# Patient Record
Sex: Male | Born: 1990 | Race: Black or African American | Hispanic: No | Marital: Single | Smoking: Former smoker
Health system: Southern US, Community
[De-identification: ages and names within clinical notes are randomized; demographics above are authoritative.]

---

## 2015-11-10 ENCOUNTER — Encounter (HOSPITAL_COMMUNITY): Payer: Self-pay

## 2015-11-10 ENCOUNTER — Emergency Department (HOSPITAL_COMMUNITY)
Admission: EM | Admit: 2015-11-10 | Discharge: 2015-11-10 | Disposition: A | Discharge status: Home / Self Care | Payer: Self-pay | Attending: Emergency Medicine | Admitting: Emergency Medicine

## 2015-11-10 DIAGNOSIS — K0889 Other specified disorders of teeth and supporting structures: Secondary | ICD-10-CM | POA: Insufficient documentation

## 2015-11-10 MED ORDER — NAPROXEN 500 MG PO TABS: 500.0000 mg | ORAL_TABLET | Freq: Two times a day (BID) | ORAL | Status: DC

## 2015-11-10 NOTE — Discharge Instructions (Signed)
Take the prescribed medication as directed. Follow-up with dentist on call or you may refer to the list of dental clinics below. Return to the ED for new or worsening symptoms.   [facility] [employer] The [employer] 211 is a great source of information about community services available.  Access by dialing 2-1-1 from anywhere in [place], or by website -  PooledIncome.plwww.nc211.org.   Other Local Resources (Updated 06/2015)  Dental  Care   Services    Phone Number and Address  Cost  [facility] For children 690 - 24 years of age:   Cleaning  Tooth brushing/flossing instruction  Sealants, fillings, crowns  Extractions  Emergency treatment  [phone] [address] [place], [place] [address] Charges based on family income.  Medicaid and some insurance plans accepted.     [facility]  Cleaning  Sealants, fillings, crowns  Extractions  Emergency treatment [phone] [address] [place], [place]  Pregnant women 24 years of age or older with a Medicaid card  [facility] [place]  Cleaning  Sealants, fillings, crowns  Extractions  Emergency treatment [phone] [address] [place], [place] Pregnant women 24 years of age or older with a Medicaid card  [facility] For children 690 - 24 years of age:   Cleaning  Tooth brushing/flossing instruction  Sealants, fillings, crowns  Extractions  Emergency treatment Limited orthodontic services for patients with Medicaid [phone] [address] [place], [place] [address] Medicaid and [facility] Health Choice cover for children up to age 24 and pregnant women.  Parents of children up to age 24 without Medicaid pay a reduced fee at time of service.  [facility] [place] For children 620 - 24 years of age:    Cleaning  Tooth brushing/flossing instruction  Sealants, fillings, crowns  Extractions  Emergency treatment Limited orthodontic services for patients with Medicaid [phone] [address] [place], [place].  Medicaid and [place] Health Choice cover for children up to age 24 and pregnant women.  Parents of children up to age 24 without Medicaid pay a reduced fee.  [facility]  Cleaning  Sealants, fillings, crowns  Extractions  Hours: Tuesdays and Thursdays, 4:15 - 8 pm [phone] [address], [address] [place], [place] [address] Services free of charge to [facility] residents ages 18-64 who do not have health insurance, Medicare, [place], or [place] benefits and fall within federal poverty guidelines  [facility]    Provides dental care in addition to primary medical care, nutritional counseling, and pharmacy:  Nurse, mental healthCleaning  Sealants, fillings, crowns  Extractions                  [phone] [facility], [address] [place], [place]  [phone] [facility], [address] [place], [place]  [phone] [facility] [place], [place]  [phone] [facility], [address] [place], [place]  [phone] [facility] [address] [place], [place] Accepts [place], [place]are, most insurance.  Also provides services available to all with fees adjusted based on ability to pay.    [facility]  Cleaning  Tooth brushing/flossing instruction  Sealants, fillings, crowns  Extractions  Emergency treatment Hours: Tuesdays, Thursdays, and Fridays from 8  am to 5 pm by appointment only. [phone] [address] [place], [place] [address] [facility] residents with Medicaid (depending on eligibility) and children with [facility] Health Choice - call for more  information.  [facility]  Extractions only  Hours: 2nd and 4th Thursday of each month from 6:30 am - 9 am.   [phone] [address] [place], [place] [address] Ages 92 and older only.  Patients are seen on a first come, first served basis.  [employer]  [employer]  Extractions  Orthodontics  Endodontics  Implants/Crowns/Bridges  Complete and partial dentures [phone] [place], [place] Patients must complete an application for services.  There is often a waiting list.

## 2015-11-10 NOTE — ED Notes (Signed)
Patient here with 2 weeks of left lower dental pain and jaw pain, wisdom tooth eruption per patient

## 2015-11-10 NOTE — ED Notes (Signed)
Declined W/C at D/C and was escorted to lobby by RN. 

## 2015-11-10 NOTE — ED Provider Notes (Signed)
CSN: [id number]     Arrival date & time 11/10/15  [id number] History  By signing my name below, I, Emmanuella Mensah, attest that this documentation has been prepared under the direction and in the presence of Sharilyn SitesLisa Sanders, PA-C. Electronically Signed: Angelene GiovanniEmmanuella Mensah, ED Scribe. 11/10/2015. 10:28 AM.    Chief Complaint  Patient presents with  . Dental Pain   The history is provided by the patient. No language interpreter was used.   HPI Comments: Roger Ford is a 25 y.o. male who presents to the Emergency Department complaining of gradually worsening left lower dental pain onset 2 weeks ago. Pt explains that he believes the pain is due to a wisdom tooth coming in. He states that he has tried Advil with temporary relief. He reports NKDA. Pt denies any fever or chills.  No facial or neck swelling.  No current dentist, [place].    History reviewed. No pertinent past medical history. History reviewed. No pertinent past surgical history. No family history on file. Social History  Substance Use Topics  . Smoking status: Never Smoker   . Smokeless tobacco: None  . Alcohol Use: None    Review of Systems  Constitutional: Negative for fever and chills.  HENT: Positive for dental problem.   All other systems reviewed and are negative.     Allergies  Review of patient's allergies indicates no known allergies.  Home Medications   Prior to Admission medications   Not on File   BP 127/81 mmHg  Pulse 54  Temp(Src) 98.2 F (36.8 C) (Oral)  Resp 16  SpO2 99%   Physical Exam  Constitutional: He is oriented to person, place, and time. He appears well-developed and well-nourished.  HENT:  Head: Normocephalic and atraumatic.  Mouth/Throat: Oropharynx is clear and moist.  Teeth largely in fair dentition, left lower wisdom tooth has erupted through gum line, surrounding gingiva normal in appearance, no signs of dental abscess, handling secretions appropriately,  no trismus, no facial or neck swelling, normal phonation without stridor  Eyes: Conjunctivae and EOM are normal. Pupils are equal, round, and reactive to light.  Neck: Normal range of motion.  Cardiovascular: Normal rate, regular rhythm and normal heart sounds.   Pulmonary/Chest: Effort normal and breath sounds normal.  Abdominal: Soft. Bowel sounds are normal.  Musculoskeletal: Normal range of motion.  Neurological: He is alert and oriented to person, place, and time.  Skin: Skin is warm and dry.  Psychiatric: He has a normal mood and affect.  Nursing note and vitals reviewed.   ED Course  Procedures (including critical care time) DIAGNOSTIC STUDIES: Oxygen Saturation is 99% on RA, normal by my interpretation.    COORDINATION OF CARE: 10:27 AM- Pt advised of plan for treatment and pt agrees. Pt will receive pain medication. Will provide resources for dental follow up.    MDM   Final diagnoses:  Pain, dental   25 year old male here with left lower dental pain for the past 2 weeks. Reports he thinks is due to a wisdom tooth coming in. On exam it does appear the patient's left lower wisdom tooth has begun erupting through the gumline. He has no signs of dental abscess currently. He has no facial or neck swelling to suggest Ludwigs angina. He is handling secretions well. Will discharge home with Naprosyn and referral to dentist on call. Also given dental resource guide.  Discussed plan with patient, he/she acknowledged understanding and agreed with plan of care.  Return  precautions given for new or worsening symptoms.  I personally performed the services described in this documentation, which was scribed in my presence. The recorded information has been reviewed and is accurate.  Garlon Hatchet, PA-C 11/10/15 1200  Mancel Bale, MD 11/10/15 2020

## 2015-12-24 ENCOUNTER — Emergency Department (HOSPITAL_COMMUNITY)
Admission: EM | Admit: 2015-12-24 | Discharge: 2015-12-24 | Disposition: A | Discharge status: Home / Self Care | Payer: Self-pay | Attending: Emergency Medicine | Admitting: Emergency Medicine

## 2015-12-24 ENCOUNTER — Encounter (HOSPITAL_COMMUNITY): Payer: Self-pay | Admitting: Emergency Medicine

## 2015-12-24 DIAGNOSIS — Z87891 Personal history of nicotine dependence: Secondary | ICD-10-CM | POA: Insufficient documentation

## 2015-12-24 DIAGNOSIS — K047 Periapical abscess without sinus: Secondary | ICD-10-CM | POA: Insufficient documentation

## 2015-12-24 MED ORDER — AMOXICILLIN 500 MG PO CAPS: 500.0000 mg | ORAL_CAPSULE | Freq: Three times a day (TID) | ORAL | Status: DC

## 2015-12-24 MED ORDER — AMOXICILLIN 500 MG PO CAPS
500.0000 mg | ORAL_CAPSULE | Freq: Once | ORAL | Status: AC
  Administered 2015-12-24: 500 mg via ORAL
  Filled 2015-12-24: qty 1

## 2015-12-24 MED ORDER — NAPROXEN 500 MG PO TABS: 500.0000 mg | ORAL_TABLET | Freq: Two times a day (BID) | ORAL | Status: DC

## 2015-12-24 MED ORDER — HYDROCODONE-ACETAMINOPHEN 5-325 MG PO TABS
1.0000 | ORAL_TABLET | Freq: Once | ORAL | Status: AC
  Administered 2015-12-24: 1 via ORAL
  Filled 2015-12-24: qty 1

## 2015-12-24 NOTE — ED Notes (Signed)
Patient c/o abscess on roof of mouth - states he noticed it yesterday, reports pain when eating on L side of mouth

## 2015-12-24 NOTE — Discharge Instructions (Signed)

## 2015-12-24 NOTE — ED Notes (Signed)
Patient verbalized understanding of discharge instructions and denies any further needs or questions at this time. VS stable. Patient ambulatory with steady gait.  

## 2015-12-24 NOTE — ED Provider Notes (Signed)
History  By signing my name below, I, Roger Ford, attest that this documentation has been prepared under the direction and in the presence of [facility] Faraz Ponciano, [place]NP. Electronically Signed: Earmon PhoenixJennifer Ford, ED Scribe. 12/24/2015. 7:33 PM.  Chief Complaint  Patient presents with  . Dental Pain   The history is provided by the patient and medical records. No language interpreter was used.    HPI Comments:  Roger Ford is a 25 y.o. male who presents to the Emergency Department complaining of upper left sided dental pain that began about one month ago. He states he was seen here for the same dental pain previously and was treated with Ibuprofen. He has not followed up with a dentist yet but his father states he is trying to get him on his insurance. He now reports an abscess on the roof of his mouth that appeared about three days ago. He has not taken anything for pain. He denies alleviating factors but reports touching the areas increase the pain. He denies otalgia, sore throat, fever, chills or difficulty swallowing.   History reviewed. No pertinent past medical history. History reviewed. No pertinent past surgical history. History reviewed. No pertinent family history. Social History  Substance Use Topics  . Smoking status: Former Smoker    Types: Cigarettes    Quit date: 10/25/2015  . Smokeless tobacco: None  . Alcohol Use: Yes     Comment: special occ    Review of Systems  Constitutional: Negative for fever and chills.  HENT: Positive for dental problem. Negative for ear pain, sore throat and trouble swallowing.   All other systems reviewed and are negative.   Allergies  Review of patient's allergies indicates no known allergies.  Home Medications   Prior to Admission medications   Medication Sig Start Date End Date Taking? Authorizing Provider  amoxicillin (AMOXIL) 500 MG capsule Take 1 capsule (500 mg total) by mouth 3 (three) times daily. 12/24/15   Joseantonio Dittmar Orlene OchM Viera Okonski, NP   naproxen (NAPROSYN) 500 MG tablet Take 1 tablet (500 mg total) by mouth 2 (two) times daily. 12/24/15   Yazlyn Wentzel Orlene OchM Loi Rennaker, NP   Triage Vitals: BP 127/65 mmHg  Pulse 57  Temp(Src) 98.2 F (36.8 C) (Oral)  Resp 18  SpO2 100% Physical Exam  Constitutional: He is oriented to person, place, and time. He appears well-developed and well-nourished.  HENT:  Mouth/Throat: Uvula is midline, oropharynx is clear and moist and mucous membranes are normal. No trismus in the jaw. Abnormal dentition. Dental abscesses and dental caries present.    Eyes: EOM are normal.  Neck: Neck supple.  Pulmonary/Chest: Effort normal.  Abdominal: Soft. There is no tenderness.  Musculoskeletal: Normal range of motion.  Lymphadenopathy:    He has no cervical adenopathy.  Neurological: He is alert and oriented to person, place, and time. No cranial nerve deficit.  Skin: Skin is warm and dry.  Nursing note and vitals reviewed.   ED Course  Procedures (including critical care time) DIAGNOSTIC STUDIES: Oxygen Saturation is 100% on RA, normal by my interpretation.   COORDINATION OF CARE: 7:03 PM- Will prescribe antibiotics and refer to a dentist. Pt verbalizes understanding and agrees to plan.  Medications  amoxicillin (AMOXIL) capsule 500 mg (500 mg Oral Given 12/24/15 1928)  HYDROcodone-acetaminophen (NORCO/VICODIN) 5-325 MG per tablet 1 tablet (1 tablet Oral Given 12/24/15 1928)    MDM   Final diagnoses:  Dental abscess    Patient with dentalgia.  No abscess requiring immediate incision and drainage.  Exam not concerning for Ludwig's angina or pharyngeal abscess.  Will treat with Amoxicillin and Vicodin. Pt instructed to follow-up with dentist. On call dentist information given to the patient.   Discussed return precautions. Pt safe for discharge.  I personally performed the services described in this documentation, which was scribed in my presence. The recorded information has been reviewed and is accurate.      [address]Aamir Mclinden MacdonaM Jan Walters, [place]NP 12/25/15 [id number]  Marily MemosJason Mesner, MD 12/26/15 [phone]

## 2019-09-07 ENCOUNTER — Ambulatory Visit
Admission: EM | Admit: 2019-09-07 | Discharge: 2019-09-07 | Disposition: A | Discharge status: Home / Self Care | Payer: BC Managed Care – PPO | Attending: Emergency Medicine | Admitting: Emergency Medicine

## 2019-09-07 DIAGNOSIS — Z0289 Encounter for other administrative examinations: Secondary | ICD-10-CM | POA: Diagnosis not present

## 2019-09-07 DIAGNOSIS — S29012A Strain of muscle and tendon of back wall of thorax, initial encounter: Secondary | ICD-10-CM

## 2019-09-07 MED ORDER — NAPROXEN 500 MG PO TABS: 500.0000 mg | ORAL_TABLET | Freq: Two times a day (BID) | ORAL | 0 refills | Status: AC

## 2019-09-07 NOTE — ED Provider Notes (Signed)
[facility]    CSN: [id number] Arrival date & time: 09/07/19  1227      History   Chief Complaint Chief Complaint  Patient presents with  . Back Pain    HPI Roger Ford is a 29 y.o. male presenting for intrascapular back pain.  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  Patient denies inciting event, trauma, fall.  States it slowly shifted up over the last few days.  Patient denying neck pain, upper extremity weakness, paresthesias, numbness.  Tried ibuprofen last night without significant relief.  States pain is worse with certain movements.  Denying pain at rest.  Denying fever, chest pain, shortness of breath.  States he was able to work the other day.   History reviewed. No pertinent past medical history.  There are no problems to display for this patient.   History reviewed. No pertinent surgical history.     Home Medications    Prior to Admission medications   Medication Sig Start Date End Date Taking? Authorizing Provider  naproxen (NAPROSYN) 500 MG tablet Take 1 tablet (500 mg total) by mouth 2 (two) times daily for 7 days. 09/07/19 09/14/19  Hall-Potvin, [place], PA-C    Family History No family history on file.  Social History Social History   Tobacco Use  . Smoking status: Former Smoker    Types: Cigarettes    Quit date: 10/25/2015    Years since quitting: 3.8  . Smokeless tobacco: Never Used  Substance Use Topics  . Alcohol use: Yes    Comment: special occ  . Drug use: Yes    Types: Marijuana     Allergies   Patient has no known allergies.   Review of Systems As per HPI   Physical Exam Triage Vital Signs ED Triage Vitals [09/07/19 1237]  Enc Vitals Group     BP 121/71     Pulse Rate (!) 57     Resp 16     Temp 98 F (36.7 C)     Temp Source Oral     SpO2 94 %     Weight      Height      Head Circumference      Peak Flow      Pain Score      Pain Loc      Pain Edu?     Excl. in [place]?    No data found.  Updated Vital Signs BP 121/71 (BP Location: Left Arm)   Pulse (!) 57   Temp 98 F (36.7 C) (Oral)   Resp 16   SpO2 94%   Visual Acuity Right Eye Distance:   Left Eye Distance:   Bilateral Distance:    Right Eye Near:   Left Eye Near:    Bilateral Near:     Physical Exam Constitutional:      General: He is not in acute distress. HENT:     Head: Normocephalic and atraumatic.  Eyes:     General: No scleral icterus.    Pupils: Pupils are equal, round, and reactive to light.  Cardiovascular:     Rate and Rhythm: Normal rate.  Pulmonary:     Effort: Pulmonary effort is normal. No respiratory distress.     Breath sounds: No wheezing.  Musculoskeletal:     Cervical back: Normal.     Thoracic back: No swelling, spasms, tenderness or bony tenderness. Normal range of motion. No scoliosis.  Lumbar back: Normal.  Skin:    Coloration: Skin is not jaundiced or pale.  Neurological:     Mental Status: He is alert and oriented to person, place, and time.      UC Treatments / Results  Labs (all labs ordered are listed, but only abnormal results are displayed) Labs Reviewed - No data to display  EKG   Radiology No results found.  Procedures Procedures (including critical care time)  Medications Ordered in UC Medications - No data to display  Initial Impression / Assessment and Plan / UC Course  I have reviewed the triage vital signs and the nursing notes.  Pertinent labs & imaging results that were available during my care of the patient were reviewed by me and considered in my medical decision making (see chart for details).     Patient appears well in office today.  Pain not reproduced on exam, and he is without injury: Radiography deferred.  Likely strain given line of work: Will trial NSAID, supportive therapy as outlined below.  Work note provided at patient's request.  Discussed importance of further follow-up should pain  persist and impact work thereafter.  Return precautions discussed, patient verbalized understanding and is agreeable to plan. Final Clinical Impressions(s) / UC Diagnoses   Final diagnoses:  Strain of thoracic back region     Discharge Instructions     Recommend RICE: rest, ice, compression, elevation as needed for pain.    Heat therapy (hot compress, warm wash red, hot showers, etc.) can help relax muscles and soothe muscle aches. Cold therapy (ice packs) can be used to help swelling both after injury and after prolonged use of areas of chronic pain/aches.  For pain: take naprosyn as directed.  May add tylenol as needed.    ED Prescriptions    Medication Sig Dispense Auth. Provider   naproxen (NAPROSYN) 500 MG tablet Take 1 tablet (500 mg total) by mouth 2 (two) times daily for 7 days. 30 tablet Hall-Potvin, [place], PA-C     I have reviewed the PDMP during this encounter.   Hall-Potvin, [place], [place] 09/07/19 1827

## 2019-09-07 NOTE — ED Triage Notes (Signed)
Pt c/o lower back pain x6months, now radiated to mid upper back x2 days. Denies injury. States works for [employer].

## 2019-09-07 NOTE — Discharge Instructions (Signed)
Recommend RICE: rest, ice, compression, elevation as needed for pain.    Heat therapy (hot compress, warm wash red, hot showers, etc.) can help relax muscles and soothe muscle aches. Cold therapy (ice packs) can be used to help swelling both after injury and after prolonged use of areas of chronic pain/aches.  For pain: take naprosyn as directed.  May add tylenol as needed.

## 2019-10-27 ENCOUNTER — Emergency Department (HOSPITAL_COMMUNITY)
Admission: EM | Admit: 2019-10-27 | Discharge: 2019-10-27 | Disposition: A | Discharge status: Home / Self Care | Payer: BC Managed Care – PPO | Attending: Emergency Medicine | Admitting: Emergency Medicine

## 2019-10-27 ENCOUNTER — Encounter (HOSPITAL_COMMUNITY): Payer: Self-pay | Admitting: Emergency Medicine

## 2019-10-27 ENCOUNTER — Emergency Department (HOSPITAL_COMMUNITY): Payer: BC Managed Care – PPO

## 2019-10-27 ENCOUNTER — Other Ambulatory Visit: Payer: Self-pay

## 2019-10-27 DIAGNOSIS — R0789 Other chest pain: Secondary | ICD-10-CM | POA: Diagnosis present

## 2019-10-27 LAB — CBC
HCT: 39.9 % (ref 39.0–52.0)
Hemoglobin: 13.1 g/dL (ref 13.0–17.0)
MCH: 27.6 pg (ref 26.0–34.0)
MCHC: 32.8 g/dL (ref 30.0–36.0)
MCV: 84 fL (ref 80.0–100.0)
Platelets: 270 10*3/uL (ref 150–400)
RBC: 4.75 MIL/uL (ref 4.22–5.81)
RDW: 12.8 % (ref 11.5–15.5)
WBC: 5.7 10*3/uL (ref 4.0–10.5)
nRBC: 0 % (ref 0.0–0.2)

## 2019-10-27 LAB — BASIC METABOLIC PANEL
Anion gap: 8 (ref 5–15)
BUN: 12 mg/dL (ref 6–20)
CO2: 27 mmol/L (ref 22–32)
Calcium: 8.8 mg/dL — ABNORMAL LOW (ref 8.9–10.3)
Chloride: 103 mmol/L (ref 98–111)
Creatinine, Ser: 1.04 mg/dL (ref 0.61–1.24)
GFR calc Af Amer: >60 mL/min (ref >60)
GFR calc non Af Amer: >60 mL/min (ref >60)
Glucose, Bld: 104 mg/dL — ABNORMAL HIGH (ref 70–99)
Potassium: 3.9 mmol/L (ref 3.5–5.1)
Sodium: 138 mmol/L (ref 135–145)

## 2019-10-27 LAB — TROPONIN I (HIGH SENSITIVITY): Troponin I (High Sensitivity): 3 ng/L (ref <18)

## 2019-10-27 NOTE — ED Notes (Signed)
Pt verbalizes understanding of DC instructions. Pt belongings returned and is ambulatory out of ED.  

## 2019-10-27 NOTE — ED Provider Notes (Addendum)
[name] Provider Note   CSN: [id number] Arrival date & time: 10/27/19  1440     History Chief Complaint  Patient presents with  . Back Pain  . Chest Pain  . Shoulder Pain    Roger Ford is a 29 y.o. male.  29 year old male with no significant PMH with complaint of pain in both shoulder and chest pain with chronic back pain. Chest pain is midsternal, worse with exertion with pain radiating to right axillary chest wall and both shoulders, denies SHOB, diaphoresis, n/v. Patient does a lot of heavy lifting at work, pain in back is worse with lifting. Pain in back located low back, does not radiate, no paresthesias, changes in bowel or bladder habits. Went to [facility] in March for this and was given Naproxen with relief. Smokes marijuana, no PE risk factors otherwise.  Denies hyperlipidemia, hypertension, diabetes.  No family cardiac history.     HPI: A 29 year old patient presents for evaluation of chest pain. Initial onset of pain was more than 6 hours ago. The patient's chest pain is sharp and is worse with exertion. The patient's chest pain is middle- or left-sided, is not well-localized, is not described as heaviness/pressure/tightness and does radiate to the arms/jaw/neck. The patient does not complain of nausea and denies diaphoresis. The patient has no history of stroke, has no history of peripheral artery disease, has not smoked in the past 90 days, denies any history of treated diabetes, has no relevant family history of coronary artery disease (first degree relative at less than age 29), is not hypertensive, has no history of hypercholesterolemia and does not have an elevated BMI (>=30).   History reviewed. No pertinent past medical history.  There are no problems to display for this patient.   History reviewed. No pertinent surgical history.     No family history on file.  Social History   Tobacco Use  . Smoking status: Former Smoker   Types: Cigarettes    Quit date: 10/25/2015    Years since quitting: 4.0  . Smokeless tobacco: Never Used  Substance Use Topics  . Alcohol use: Yes    Comment: special occ  . Drug use: Yes    Types: Marijuana    Home Medications Prior to Admission medications   Not on File    Allergies    Patient has no known allergies.  Review of Systems   Review of Systems  Constitutional: Negative for diaphoresis and fever.  Respiratory: Negative for shortness of breath.   Cardiovascular: Positive for chest pain.  Gastrointestinal: Negative for abdominal pain, nausea and vomiting.  Musculoskeletal: Positive for back pain and myalgias.  Skin: Negative for rash and wound.  Allergic/Immunologic: Negative for immunocompromised state.  Neurological: Negative for weakness and numbness.  All other systems reviewed and are negative.   Physical Exam Updated Vital Signs BP 120/62   Pulse (!) 57   Temp 98.8 F (37.1 C) (Oral)   Resp 20   Ht 5\' 10"  (1.778 m)   Wt 60.8 kg   SpO2 100%   BMI 19.23 kg/m   Physical Exam Vitals and nursing note reviewed.  Constitutional:      General: He is not in acute distress.    Appearance: He is well-developed. He is not diaphoretic.  HENT:     Head: Normocephalic and atraumatic.  Neck:     Vascular: No JVD.  Cardiovascular:     Rate and Rhythm: Normal rate and regular rhythm.  Heart sounds: Normal heart sounds.  Pulmonary:     Effort: Pulmonary effort is normal.     Breath sounds: Normal breath sounds.  Chest:     Chest wall: No tenderness.  Abdominal:     Palpations: Abdomen is soft.     Tenderness: There is no abdominal tenderness.  Musculoskeletal:     Right lower leg: No edema.     Left lower leg: No edema.  Skin:    General: Skin is warm and dry.     Findings: No erythema or rash.  Neurological:     Mental Status: He is alert and oriented to person, place, and time.  Psychiatric:        Behavior: Behavior normal.     ED  Results / Procedures / Treatments   Labs (all labs ordered are listed, but only abnormal results are displayed) Labs Reviewed  BASIC METABOLIC PANEL - Abnormal; Notable for the following components:      Result Value   Glucose, Bld 104 (*)    Calcium 8.8 (*)    All other components within normal limits  CBC  TROPONIN I (HIGH SENSITIVITY)    EKG Non specific t-wave changes  Radiology No results found.  Procedures Procedures (including critical care time)  Medications Ordered in ED Medications - No data to display  ED Course  I have reviewed the triage vital signs and the nursing notes.  Pertinent labs & imaging results that were available during my care of the patient were reviewed by me and considered in my medical decision making (see chart for details).  Clinical Course as of Nov 12 1515  Thu Oct 27, 2019  [id number] 29 year old male with complaint of ongoing pain in his low back, had improved with naproxen, pain is unchanged at this point.  Also here for complaint of left-sided chest pain which radiates to bilateral shoulder areas, is worse with exertion, coughing, laughing, lying on his left side, no history of recent URI or fevers.  On exam, patient is thin, well-appearing, no musculoskeletal tenderness, no chest wall tenderness.  Lungs are clear, heart is regular in rhythm.  EKG with nonspecific T wave changes.  Troponin is negative, CBC and BMP without significant findings, vitals are reassuring.  Chest x-ray unremarkable.  Patient has a hear score of 2, will discharge with plan to follow-up with cardiology.   [LM]    Clinical Course User Index [LM] [name]   MDM Rules/Calculators/A&P HEAR Score: 2                     Final Clinical Impression(s) / ED Diagnoses Final diagnoses:  Atypical chest pain    Rx / DC Orders ED Discharge Orders    None       [name] 10/27/19 1748    Gwyneth Sprout, MD 10/28/19 0004    Jeannie Fend, PA-C 11/12/19 1518    Gwyneth Sprout, MD 11/13/19 2103

## 2019-10-27 NOTE — ED Triage Notes (Signed)
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

## 2019-10-27 NOTE — Discharge Instructions (Addendum)
Follow up with cardiology as discussed. Return to ER for new or worsening symptoms.

## 2019-11-22 ENCOUNTER — Other Ambulatory Visit: Payer: Self-pay | Admitting: Urgent Care

## 2019-11-22 DIAGNOSIS — R945 Abnormal results of liver function studies: Secondary | ICD-10-CM

## 2019-12-14 ENCOUNTER — Inpatient Hospital Stay: Admission: RE | Admit: 2019-12-14 | Payer: BC Managed Care – PPO | Source: Ambulatory Visit

## 2021-07-15 IMAGING — DX DG CHEST 2V
2 series · 2 of 2 positions shown · non-contrast
Comparison: None.

CLINICAL DATA: Chest pain

EXAM:
CHEST - 2 VIEW

[chest pa]
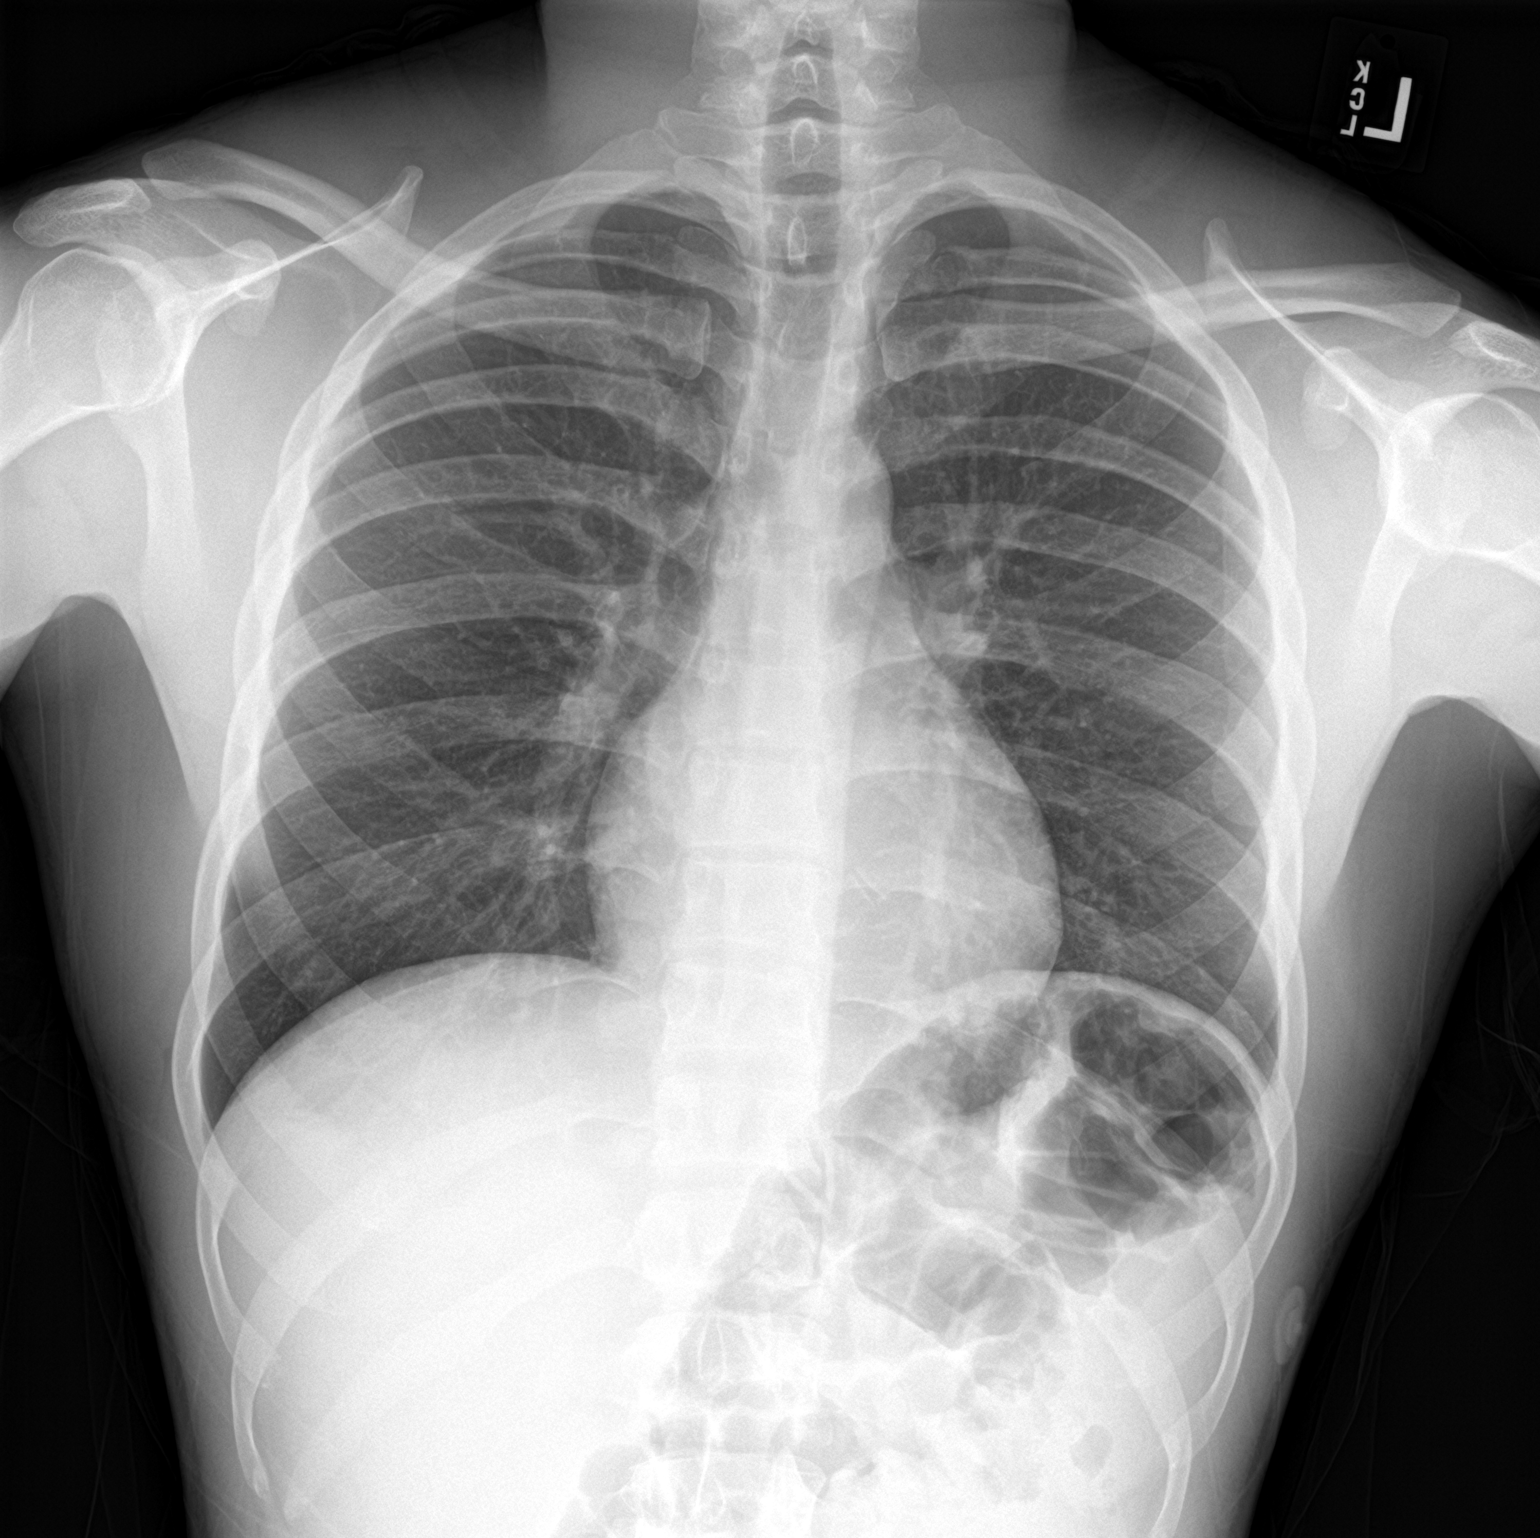

[chest lat]
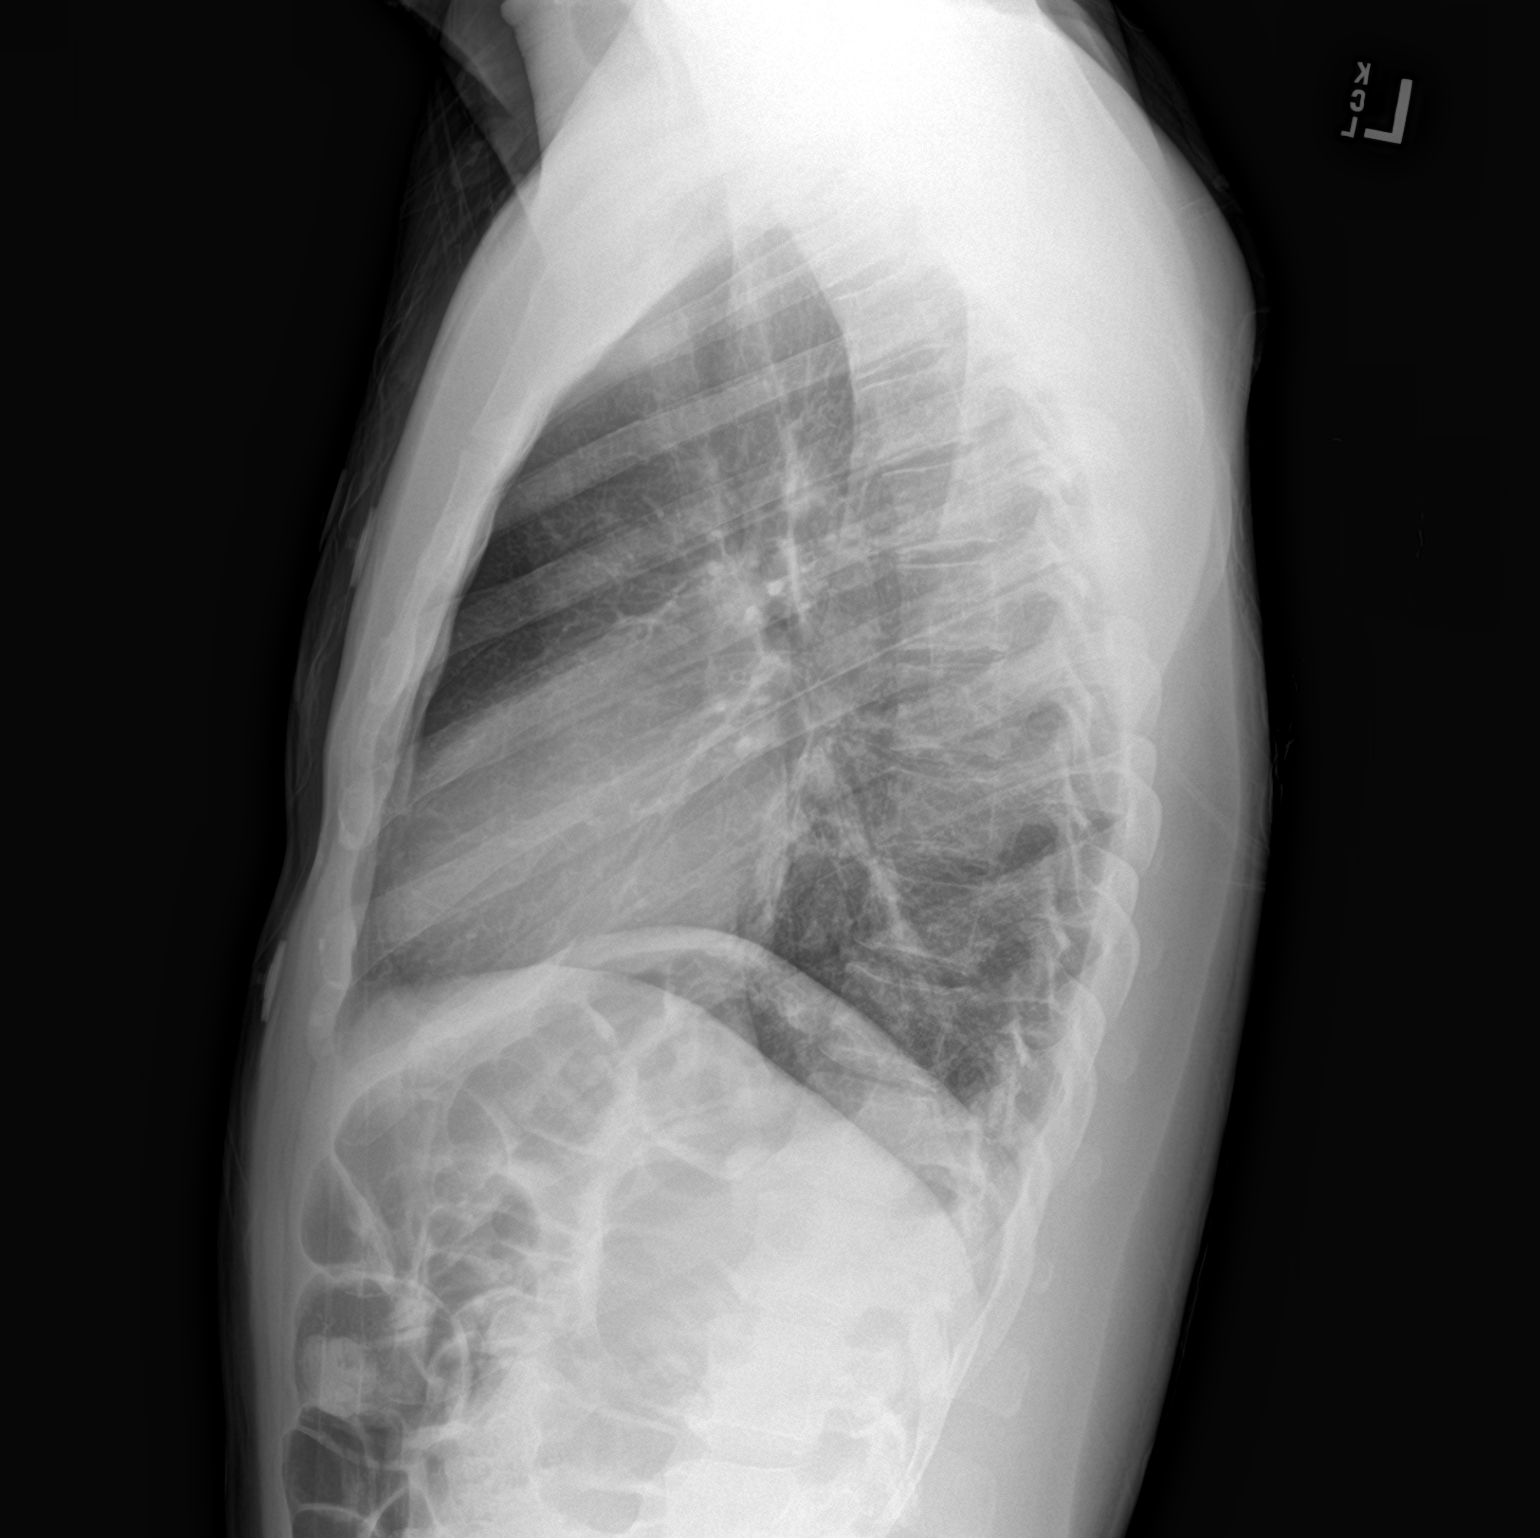

[2 of 2 positions shown; findings below may reference images not displayed]

FINDINGS: The heart size and mediastinal contours are within normal limits.
Both lungs are clear. No pleural effusion or pneumothorax. The
visualized skeletal structures are unremarkable.
IMPRESSION: No acute process in the chest.
